# Patient Record
Sex: Female | Born: 1960 | Race: White | Hispanic: No | Marital: Married | Smoking: Former smoker
Health system: Southern US, Community
[De-identification: ages and names within clinical notes are randomized; demographics above are authoritative.]

## PROBLEM LIST (undated history)

## (undated) DIAGNOSIS — R5383 Other fatigue: Secondary | ICD-10-CM

## (undated) DIAGNOSIS — Z8249 Family history of ischemic heart disease and other diseases of the circulatory system: Secondary | ICD-10-CM

## (undated) DIAGNOSIS — F32A Depression, unspecified: Secondary | ICD-10-CM

## (undated) DIAGNOSIS — E039 Hypothyroidism, unspecified: Secondary | ICD-10-CM

## (undated) DIAGNOSIS — L659 Nonscarring hair loss, unspecified: Secondary | ICD-10-CM

## (undated) DIAGNOSIS — F419 Anxiety disorder, unspecified: Secondary | ICD-10-CM

## (undated) DIAGNOSIS — F909 Attention-deficit hyperactivity disorder, unspecified type: Secondary | ICD-10-CM

## (undated) DIAGNOSIS — R001 Bradycardia, unspecified: Secondary | ICD-10-CM

## (undated) DIAGNOSIS — E079 Disorder of thyroid, unspecified: Secondary | ICD-10-CM

## (undated) HISTORY — DX: Disorder of thyroid, unspecified: E07.9

## (undated) HISTORY — DX: Depression, unspecified: F32.A

## (undated) HISTORY — DX: Anxiety disorder, unspecified: F41.9

## (undated) HISTORY — DX: Other fatigue: R53.83

## (undated) HISTORY — DX: Nonscarring hair loss, unspecified: L65.9

## (undated) HISTORY — DX: Hypothyroidism, unspecified: E03.9

## (undated) HISTORY — DX: Attention-deficit hyperactivity disorder, unspecified type: F90.9

## (undated) HISTORY — DX: Family history of ischemic heart disease and other diseases of the circulatory system: Z82.49

## (undated) HISTORY — PX: COSMETIC SURGERY: SHX468

## (undated) HISTORY — DX: Bradycardia, unspecified: R00.1

---

## 1988-11-19 HISTORY — PX: PLACEMENT OF BREAST IMPLANTS: SHX6334

## 1999-02-06 ENCOUNTER — Other Ambulatory Visit: Admission: RE | Admit: 1999-02-06 | Discharge: 1999-02-06 | Payer: Self-pay | Admitting: Obstetrics and Gynecology

## 1999-03-05 ENCOUNTER — Encounter: Payer: Self-pay | Admitting: Obstetrics & Gynecology

## 1999-03-05 ENCOUNTER — Inpatient Hospital Stay (HOSPITAL_COMMUNITY): Admission: AD | Admit: 1999-03-05 | Discharge: 1999-03-05 | Payer: Self-pay | Admitting: Obstetrics & Gynecology

## 1999-05-09 ENCOUNTER — Inpatient Hospital Stay (HOSPITAL_COMMUNITY): Admission: AD | Admit: 1999-05-09 | Discharge: 1999-05-09 | Payer: Self-pay | Admitting: Obstetrics and Gynecology

## 1999-08-07 ENCOUNTER — Encounter: Payer: Self-pay | Admitting: Obstetrics and Gynecology

## 1999-08-07 ENCOUNTER — Inpatient Hospital Stay (HOSPITAL_COMMUNITY): Admission: AD | Admit: 1999-08-07 | Discharge: 1999-08-07 | Payer: Self-pay | Admitting: Obstetrics and Gynecology

## 1999-08-09 ENCOUNTER — Inpatient Hospital Stay (HOSPITAL_COMMUNITY): Admission: AD | Admit: 1999-08-09 | Discharge: 1999-08-09 | Payer: Self-pay | Admitting: Obstetrics and Gynecology

## 1999-08-10 ENCOUNTER — Encounter (INDEPENDENT_AMBULATORY_CARE_PROVIDER_SITE_OTHER): Payer: Self-pay

## 1999-08-10 ENCOUNTER — Inpatient Hospital Stay (HOSPITAL_COMMUNITY): Admission: AD | Admit: 1999-08-10 | Discharge: 1999-08-13 | Payer: Self-pay | Admitting: Obstetrics and Gynecology

## 1999-09-12 ENCOUNTER — Other Ambulatory Visit: Admission: RE | Admit: 1999-09-12 | Discharge: 1999-09-12 | Payer: Self-pay | Admitting: Obstetrics and Gynecology

## 2000-09-24 ENCOUNTER — Other Ambulatory Visit: Admission: RE | Admit: 2000-09-24 | Discharge: 2000-09-24 | Payer: Self-pay | Admitting: Obstetrics and Gynecology

## 2001-12-02 ENCOUNTER — Other Ambulatory Visit: Admission: RE | Admit: 2001-12-02 | Discharge: 2001-12-02 | Payer: Self-pay | Admitting: Obstetrics and Gynecology

## 2002-12-23 ENCOUNTER — Other Ambulatory Visit: Admission: RE | Admit: 2002-12-23 | Discharge: 2002-12-23 | Payer: Self-pay | Admitting: Obstetrics and Gynecology

## 2003-01-12 ENCOUNTER — Ambulatory Visit (HOSPITAL_COMMUNITY): Admission: RE | Admit: 2003-01-12 | Discharge: 2003-01-12 | Payer: Self-pay | Admitting: Gastroenterology

## 2003-02-19 ENCOUNTER — Other Ambulatory Visit: Admission: RE | Admit: 2003-02-19 | Discharge: 2003-02-19 | Payer: Self-pay | Admitting: Obstetrics and Gynecology

## 2004-03-24 ENCOUNTER — Other Ambulatory Visit: Admission: RE | Admit: 2004-03-24 | Discharge: 2004-03-24 | Payer: Self-pay | Admitting: Obstetrics and Gynecology

## 2005-04-26 ENCOUNTER — Other Ambulatory Visit: Admission: RE | Admit: 2005-04-26 | Discharge: 2005-04-26 | Payer: Self-pay | Admitting: Obstetrics and Gynecology

## 2005-11-28 ENCOUNTER — Encounter: Admission: RE | Admit: 2005-11-28 | Discharge: 2005-11-28 | Payer: Self-pay | Admitting: Family Medicine

## 2009-09-09 ENCOUNTER — Ambulatory Visit (HOSPITAL_COMMUNITY): Admission: RE | Admit: 2009-09-09 | Discharge: 2009-09-09 | Payer: Self-pay | Admitting: Obstetrics and Gynecology

## 2009-09-09 ENCOUNTER — Encounter (INDEPENDENT_AMBULATORY_CARE_PROVIDER_SITE_OTHER): Payer: Self-pay | Admitting: Obstetrics and Gynecology

## 2011-02-22 LAB — CBC
HCT: 39.9 % (ref 36.0–46.0)
Hemoglobin: 13.5 g/dL (ref 12.0–15.0)
MCHC: 34 g/dL (ref 30.0–36.0)
MCV: 93 fL (ref 78.0–100.0)
Platelets: 248 10*3/uL (ref 150–400)
RBC: 4.28 MIL/uL (ref 3.87–5.11)
RDW: 12.4 % (ref 11.5–15.5)
WBC: 7.3 10*3/uL (ref 4.0–10.5)

## 2011-02-22 LAB — HCG, SERUM, QUALITATIVE: Preg, Serum: NEGATIVE

## 2012-11-19 HISTORY — PX: COLONOSCOPY: SHX174

## 2013-07-10 ENCOUNTER — Encounter: Payer: Self-pay | Admitting: Gastroenterology

## 2013-08-06 ENCOUNTER — Ambulatory Visit (INDEPENDENT_AMBULATORY_CARE_PROVIDER_SITE_OTHER): Payer: BC Managed Care – PPO | Admitting: Gastroenterology

## 2013-08-06 ENCOUNTER — Encounter: Payer: Self-pay | Admitting: Gastroenterology

## 2013-08-06 VITALS — BP 100/60 | HR 88 | Ht 65.0 in | Wt 143.6 lb

## 2013-08-06 DIAGNOSIS — R198 Other specified symptoms and signs involving the digestive system and abdomen: Secondary | ICD-10-CM

## 2013-08-06 DIAGNOSIS — R194 Change in bowel habit: Secondary | ICD-10-CM | POA: Insufficient documentation

## 2013-08-06 NOTE — Progress Notes (Signed)
History of Present Illness: Pleasant 52 year old white female referred for evaluation of change of bowel habits.  For several months she's had intermittent episodes of crampy lower bowel pain followed by 3-4 loose stools with mucus.  This may occur once a week.  She denies rectal bleeding or melena.  There has been no change in medications or use of antibiotics.  Weight is stable.    Past Medical History  Diagnosis Date  . Anxiety    History reviewed. No pertinent past surgical history. family history includes Breast cancer in an other family member; Diabetes in an other family member; Heart disease in an other family member; Liver disease in an other family member. Current Outpatient Prescriptions  Medication Sig Dispense Refill  . amphetamine-dextroamphetamine (ADDERALL) 20 MG tablet Take 20 mg by mouth daily.      Marland Kitchen aspirin 81 MG tablet Take 81 mg by mouth daily.      Marland Kitchen buPROPion (WELLBUTRIN XL) 150 MG 24 hr tablet Take 150 mg by mouth daily.      . fish oil-omega-3 fatty acids 1000 MG capsule Take 2 g by mouth daily.      . Multiple Vitamin (MULTIVITAMIN) tablet Take 1 tablet by mouth daily.      . Probiotic Product (PROBIOTIC DAILY PO) Take by mouth.       No current facility-administered medications for this visit.   Allergies as of 08/06/2013  . (No Known Allergies)    reports that she has quit smoking. She has never used smokeless tobacco. She reports that she does not drink alcohol or use illicit drugs.     Review of Systems: Pertinent positive and negative review of systems were noted in the above HPI section. All other review of systems were otherwise negative.  Vital signs were reviewed in today's medical record Physical Exam: General: Well developed , well nourished, no acute distress Skin: anicteric Head: Normocephalic and atraumatic Eyes:  sclerae anicteric, EOMI Ears: Normal auditory acuity Mouth: No deformity or lesions Neck: Supple, no masses or  thyromegaly Lungs: Clear throughout to auscultation Heart: Regular rate and rhythm; no murmurs, rubs or bruits Abdomen: Soft, non tender and non distended. No masses, hepatosplenomegaly or hernias noted. Normal Bowel sounds Rectal:deferred Musculoskeletal: Symmetrical with no gross deformities  Skin: No lesions on visible extremities Pulses:  Normal pulses noted Extremities: No clubbing, cyanosis, edema or deformities noted Neurological: Alert oriented x 4, grossly nonfocal Cervical Nodes:  No significant cervical adenopathy Inguinal Nodes: No significant inguinal adenopathy Psychological:  Alert and cooperative. Normal mood and affect

## 2013-08-06 NOTE — Patient Instructions (Addendum)

## 2013-08-06 NOTE — Assessment & Plan Note (Signed)
History of intermittent crampy abdominal pain with multiple loose stools and mucus of unclear etiology.  She's had no intolerance to foods in the past but this could be a possibility.  A structural abnormality the colon should be ruled out.  Recommendations #1 colonoscopy #2 patient was instructed to take a dietary history when she is symptomatic

## 2013-08-07 ENCOUNTER — Ambulatory Visit (AMBULATORY_SURGERY_CENTER): Payer: BC Managed Care – PPO | Admitting: Gastroenterology

## 2013-08-07 ENCOUNTER — Encounter: Payer: Self-pay | Admitting: Gastroenterology

## 2013-08-07 VITALS — BP 95/67 | HR 73 | Temp 97.5°F | Resp 16 | Ht 65.0 in | Wt 143.0 lb

## 2013-08-07 DIAGNOSIS — D126 Benign neoplasm of colon, unspecified: Secondary | ICD-10-CM

## 2013-08-07 DIAGNOSIS — R198 Other specified symptoms and signs involving the digestive system and abdomen: Secondary | ICD-10-CM

## 2013-08-07 MED ORDER — HYDROCORTISONE ACETATE 25 MG RE SUPP: RECTAL | Status: DC

## 2013-08-07 MED ORDER — SODIUM CHLORIDE 0.9 % IV SOLN: 500.0000 mL | INTRAVENOUS | Status: DC

## 2013-08-07 NOTE — Progress Notes (Signed)
Called to room to assist during endoscopic procedure.  Patient ID and intended procedure confirmed with present staff. Received instructions for my participation in the procedure from the performing physician.  

## 2013-08-07 NOTE — Patient Instructions (Addendum)

## 2013-08-07 NOTE — Op Note (Signed)
[facility] [address] [place], [address]   COLONOSCOPY PROCEDURE REPORT  PATIENT: Danielle Hudson, Danielle Hudson  MR#: [id number] BIRTHDATE: 07/15/1961 , 51  yrs. old GENDER: Female ENDOSCOPIST: Louis Meckel, MD REFERRED BY: PROCEDURE DATE:  08/07/2013 PROCEDURE:   Colonoscopy with biopsy First Screening Colonoscopy - Avg.  risk and is 50 yrs.  old or older Yes.  Prior Negative Screening - Now for repeat screening. N/A  History of Adenoma - Now for follow-up colonoscopy & has been > or = to 3 yrs.  N/A  Polyps Removed Today? No.  Recommend repeat exam, <10 yrs? No. ASA CLASS:   Class I INDICATIONS:Unexplained diarrhea. MEDICATIONS: MAC sedation, administered by CRNA and propofol (Diprivan) 250mg  IV  DESCRIPTION OF PROCEDURE:   After the risks benefits and alternatives of the procedure were thoroughly explained, informed consent was obtained.  A digital rectal exam revealed no abnormalities of the rectum.   The LB [id number]  endoscope was introduced through the anus and advanced to the terminal ileum which was intubated for a short distance. No adverse events experienced.   The quality of the prep was excellent using Suprep The instrument was then slowly withdrawn as the colon was fully examined.      COLON FINDINGS: In the rectal vault there appear to be faint erythema and edema.  Multiple biopsies were taken.   The colon mucosa was otherwise normal.   The mucosa appeared normal in the terminal ileum.  Retroflexed views revealed no abnormalities. The time to cecum=3 minutes 43 seconds.  Withdrawal time=9 minutes 27 seconds.  The scope was withdrawn and the procedure completed. COMPLICATIONS: There were no complications.  ENDOSCOPIC IMPRESSION: 1.   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

## 2013-08-07 NOTE — Progress Notes (Signed)
Stable

## 2013-08-07 NOTE — Progress Notes (Signed)
Patient did not experience any of the following events: a burn prior to discharge; a fall within the facility; wrong site/side/patient/procedure/implant event; or a hospital transfer or hospital admission upon discharge from the facility. (G8907) Patient did not have preoperative order for IV antibiotic SSI prophylaxis. (G8918)  

## 2013-08-10 ENCOUNTER — Telehealth: Payer: Self-pay | Admitting: *Deleted

## 2013-08-10 NOTE — Telephone Encounter (Signed)
  Follow up Call-  Call back number 08/07/2013  Post procedure Call Back phone  # [phone]  Permission to leave phone message Yes     Patient questions:  Do you have a fever, pain , or abdominal swelling? no Pain Score  0 *  Have you tolerated food without any problems? yes  Have you been able to return to your normal activities? yes  Do you have any questions about your discharge instructions: Diet   no Medications  no Follow up visit  no  Do you have questions or concerns about your Care? no  Actions: * If pain score is 4 or above: No action needed, pain <4.

## 2013-08-17 NOTE — Progress Notes (Signed)
Quick Note:  Please inform the patient that biopsies were normal and to continue current plan of action ______

## 2013-08-20 ENCOUNTER — Encounter: Payer: Self-pay | Admitting: *Deleted

## 2013-08-24 ENCOUNTER — Telehealth: Payer: Self-pay | Admitting: *Deleted

## 2013-08-24 NOTE — Telephone Encounter (Signed)
No answer, left message for pt to give [place] a call back. Calling to give pt results of biopsies per Dr Arlyce Dice.

## 2013-08-31 ENCOUNTER — Encounter: Payer: Self-pay | Admitting: Gastroenterology

## 2013-08-31 ENCOUNTER — Ambulatory Visit (INDEPENDENT_AMBULATORY_CARE_PROVIDER_SITE_OTHER): Payer: BC Managed Care – PPO | Admitting: Gastroenterology

## 2013-08-31 VITALS — BP 100/70 | HR 92 | Ht 66.0 in | Wt 147.5 lb

## 2013-08-31 DIAGNOSIS — R194 Change in bowel habit: Secondary | ICD-10-CM

## 2013-08-31 DIAGNOSIS — R198 Other specified symptoms and signs involving the digestive system and abdomen: Secondary | ICD-10-CM

## 2013-08-31 NOTE — Assessment & Plan Note (Signed)
No abnormalities seen by colonoscopy.  Patient has no GI complaints referable to constipation.  Recommendations #1 fiber supplementation as needed

## 2013-08-31 NOTE — Patient Instructions (Signed)
Follow up as needed

## 2013-08-31 NOTE — Progress Notes (Signed)
History of Present Illness:  Danielle Hudson has returned following colonoscopy.  No specific abnormalities were seen (question of proctitis was not verified by pathology)  Random biopsies were negative.  She has a bowel movement every 2-3 days.    Review of Systems: Pertinent positive and negative review of systems were noted in the above HPI section. All other review of systems were otherwise negative.    Current Medications, Allergies, Past Medical History, Past Surgical History, Family History and Social History were reviewed in [employer] electronic medical record  Vital signs were reviewed in today's medical record. Physical Exam: General: Well developed , well nourished, no acute distress

## 2013-09-14 DIAGNOSIS — Z7189 Other specified counseling: Secondary | ICD-10-CM | POA: Insufficient documentation

## 2014-10-25 ENCOUNTER — Other Ambulatory Visit: Payer: Self-pay | Admitting: Obstetrics and Gynecology

## 2014-10-26 LAB — CYTOLOGY - PAP

## 2017-02-25 ENCOUNTER — Encounter: Payer: Self-pay | Admitting: *Deleted

## 2017-03-07 ENCOUNTER — Ambulatory Visit (INDEPENDENT_AMBULATORY_CARE_PROVIDER_SITE_OTHER): Payer: Self-pay | Admitting: *Deleted

## 2017-03-07 ENCOUNTER — Ambulatory Visit: Payer: Self-pay | Admitting: *Deleted

## 2017-03-07 DIAGNOSIS — I781 Nevus, non-neoplastic: Secondary | ICD-10-CM

## 2017-03-07 NOTE — Progress Notes (Signed)
X=.3% Sotradecol administered with a 27g butterfly.  Patient received a total of 6cc.  Treated all areas of concern. She'll be a good candidate for CL from here on out. Tol well. Will follow prn.    Compression stockings applied: Yes.

## 2017-03-13 ENCOUNTER — Encounter: Payer: Self-pay | Admitting: *Deleted

## 2018-06-26 ENCOUNTER — Encounter: Payer: Self-pay | Admitting: Podiatry

## 2018-06-26 ENCOUNTER — Ambulatory Visit (INDEPENDENT_AMBULATORY_CARE_PROVIDER_SITE_OTHER): Payer: BLUE CROSS/BLUE SHIELD

## 2018-06-26 ENCOUNTER — Ambulatory Visit (INDEPENDENT_AMBULATORY_CARE_PROVIDER_SITE_OTHER): Payer: BLUE CROSS/BLUE SHIELD | Admitting: Podiatry

## 2018-06-26 ENCOUNTER — Other Ambulatory Visit: Payer: Self-pay | Admitting: Podiatry

## 2018-06-26 DIAGNOSIS — D169 Benign neoplasm of bone and articular cartilage, unspecified: Secondary | ICD-10-CM

## 2018-06-26 DIAGNOSIS — M25571 Pain in right ankle and joints of right foot: Secondary | ICD-10-CM

## 2018-06-26 DIAGNOSIS — M722 Plantar fascial fibromatosis: Secondary | ICD-10-CM

## 2018-06-26 DIAGNOSIS — M7662 Achilles tendinitis, left leg: Secondary | ICD-10-CM

## 2018-06-26 MED ORDER — TRIAMCINOLONE ACETONIDE 10 MG/ML IJ SUSP
10.0000 mg | Freq: Once | INTRAMUSCULAR | Status: AC
  Administered 2018-06-26: 10 mg

## 2018-06-26 MED ORDER — DICLOFENAC SODIUM 75 MG PO TBEC: 75.0000 mg | DELAYED_RELEASE_TABLET | Freq: Two times a day (BID) | ORAL | 2 refills | Status: DC

## 2018-06-26 NOTE — Progress Notes (Signed)
   Subjective:    Patient ID: Danielle Hudson, female    DOB: 12-27-1960, 57 y.o.   MRN: [id number]  HPI    Review of Systems  All other systems reviewed and are negative.      Objective:   Physical Exam        Assessment & Plan:

## 2018-06-26 NOTE — Patient Instructions (Signed)

## 2018-06-26 NOTE — Progress Notes (Signed)
Subjective:   Patient ID: Danielle Hudson, female   DOB: 57 y.o.   MRN: [id number]   HPI Patient presents stating that she is had a lot of pain in the inside of her left ankle and she has problems with her left fifth toe with inability to wear shoe gear comfortably and also a knot in her left arch of several months duration.  Patient states that she had a previous injection of the heel and wear a boot but only for a couple days and is been very tender that was done approximately 3 months ago.  Patient does not smoke and likes to be active   Review of Systems  All other systems reviewed and are negative.       Objective:  Physical Exam  Constitutional: She appears well-developed and well-nourished.  Cardiovascular: Intact distal pulses.  Pulmonary/Chest: Effort normal.  Musculoskeletal: Normal range of motion.  Neurological: She is alert.  Skin: Skin is warm.  Nursing note and vitals reviewed.   Neurovascular status intact with muscle strength adequate range of motion within normal limits.  Patient is found to have quite a bit inflammation of the medial side of the Achilles at its insertion the calcaneus with no indications of tendon dysfunction or damage.  Patient has spur on the left fifth digit medial side is painful when pressed and also has a small nodule in the plantar aspect of the left mid arch measuring about 6 x 6 mm and it appears to be within the plantar fascia itself.  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  I do think an injection of the left Achilles along with continued complete immobilization would be her best option and I  did discuss that there is no guarantee that this will solve it and that the possibilities for rupture exists with the injection.  He is willing to accept the risk of the injection understanding possibility for rupture and today I did a sterile prep in the medial side that did not into the Achilles but medial to it with 3 mg dexamethasone Kenalog 5 mg Xylocaine instructed on the importance of boot usage advised on ice therapy physical therapy placed on oral anti-inflammatory diclofenac 75 mg twice daily and discussed surgical correction of the spur on her left fifth digit which we can do in the office.  The arch will be watched and may require treatment depending on how it progresses  X-ray indicates minimal posterior spurring with spur on the fifth digit left foot and no indications calcification of the arch

## 2018-07-17 ENCOUNTER — Ambulatory Visit: Payer: BLUE CROSS/BLUE SHIELD | Admitting: Podiatry

## 2018-07-18 ENCOUNTER — Telehealth: Payer: Self-pay | Admitting: *Deleted

## 2018-07-18 ENCOUNTER — Encounter: Payer: Self-pay | Admitting: Podiatry

## 2018-07-18 ENCOUNTER — Ambulatory Visit: Payer: BLUE CROSS/BLUE SHIELD | Admitting: Podiatry

## 2018-07-18 DIAGNOSIS — M7662 Achilles tendinitis, left leg: Secondary | ICD-10-CM | POA: Diagnosis not present

## 2018-07-18 DIAGNOSIS — D169 Benign neoplasm of bone and articular cartilage, unspecified: Secondary | ICD-10-CM | POA: Diagnosis not present

## 2018-07-18 NOTE — Progress Notes (Signed)
Subjective:   Patient ID: Danielle Hudson, female   DOB: 57 y.o.   MRN: [id number]   HPI Patient states she is feeling a lot better with minimal discomfort and is able to walk without significant pain.  States the fifth toe is still really bothering her   ROS      Objective:  Physical Exam  Achilles tendon left at the insertion into the calcaneus lateral side is doing much better with significant reduction of pain with patient's fifth toe left being very tender upon palpation with a spur on the medial side of the digit.  Patient also has a small keratotic lesion associated with this     Assessment:  Exostosis fifth toe left with pain and Achilles tendinitis left improved     Plan:  H&P condition reviewed and for the Achilles tendon continue stretching exercises.  For the spur I recommended removal and I allowed her to read consent form going over exostectomy and alternative treatments complications.  Patient wants surgery understanding risk and at this point signed consent form after extensive review and is scheduled for office outpatient surgery and is encouraged to call with questions

## 2018-07-18 NOTE — Telephone Encounter (Signed)
"  I need to set up an in office surgical procedure.  I was told I need to set that up for a Wednesday.  I would need for that to be late in the day as possible.  If you could give me a call back.  I'd greatly appreciate it.  Let me know what we can set up.  If you can't call me today, I hope you have a great labor day weekend."

## 2018-07-18 NOTE — Patient Instructions (Signed)
Pre-Operative Instructions  Congratulations, you have decided to take an important step towards improving your quality of life.  You can be assured that the doctors and staff at [facility] will be with you every step of the way.  Here are some important things you should know:  1. Plan to be at the surgery center/hospital at least 1 (one) hour prior to your scheduled time, unless otherwise directed by the surgical center/hospital staff.  You must have a responsible adult accompany you, remain during the surgery and drive you home.  Make sure you have directions to the surgical center/hospital to ensure you arrive on time. 2. If you are having surgery at [facility] or [facility] hospitals, you will need a copy of your medical history and physical form from your family physician within one month prior to the date of surgery. We will give you a form for your primary physician to complete.  3. We make every effort to accommodate the date you request for surgery.  However, there are times where surgery dates or times have to be moved.  We will contact you as soon as possible if a change in schedule is required.   4. No aspirin/ibuprofen for one week before surgery.  If you are on aspirin, any non-steroidal anti-inflammatory medications (Mobic, Aleve, Ibuprofen) should not be taken seven (7) days prior to your surgery.  You make take Tylenol for pain prior to surgery.  5. Medications - If you are taking daily heart and blood pressure medications, seizure, reflux, allergy, asthma, anxiety, pain or diabetes medications, make sure you notify the surgery center/hospital before the day of surgery so they can tell you which medications you should take or avoid the day of surgery. 6. No food or drink after midnight the night before surgery unless directed otherwise by surgical center/hospital staff. 7. No alcoholic beverages 24-hours prior to surgery.  No smoking 24-hours prior or 24-hours after  surgery. 8. Wear loose pants or shorts. They should be loose enough to fit over bandages, boots, and casts. 9. Don't wear slip-on shoes. Sneakers are preferred. 10. Bring your boot with you to the surgery center/hospital.  Also bring crutches or a walker if your physician has prescribed it for you.  If you do not have this equipment, it will be provided for you after surgery. 11. If you have not been contacted by the surgery center/hospital by the day before your surgery, call to confirm the date and time of your surgery. 12. Leave-time from work may vary depending on the type of surgery you have.  Appropriate arrangements should be made prior to surgery with your employer. 13. Prescriptions will be provided immediately following surgery by your doctor.  Fill these as soon as possible after surgery and take the medication as directed. Pain medications will not be refilled on weekends and must be approved by the doctor. 14. Remove nail polish on the operative foot and avoid getting pedicures prior to surgery. 15. Wash the night before surgery.  The night before surgery wash the foot and leg well with water and the antibacterial soap provided. Be sure to pay special attention to beneath the toenails and in between the toes.  Wash for at least three (3) minutes. Rinse thoroughly with water and dry well with a towel.  Perform this wash unless told not to do so by your physician.  Enclosed: 1 Ice pack (please put in freezer the night before surgery)   1 Hibiclens skin cleaner     Pre-op instructions  If you have any questions regarding the instructions, please do not hesitate to call our office.  [place]: [address], [place], [place] [address] -- [phone]  [place]: [address] [place], [place] [address] -- [phone]  [place]: [address]  [place], [place] [address] -- [phone]  [place]: [address], [address], [place],  [address] -- [phone]  Website:  [email or web address] 

## 2018-07-24 NOTE — Telephone Encounter (Signed)
"  I need set up a surgery for a minor procedure on my small toe.  I was told you only do that on Wednesdays.  I want to know what the latest appointment will be so I can get my appointment book arranged around that.  Give me a call back."

## 2018-07-29 NOTE — Telephone Encounter (Signed)
I am returning your call.  You want to set up your office surgery?  The latest he can do it is 3 pm.  "Oh, that's great.  Yes, I would like to schedule it.  What does he have available."  He can do it on October 2.  "I have a prior engagement, can we do it the following week?"  Yes, he can do it on October 9, be here at 3 pm.

## 2018-07-29 NOTE — Telephone Encounter (Signed)
I called her back and informed her that Dr. Paulla Dolly will be out of the office on October 9.  I asked her if we could reschedule it to October 16.  She said that would be fine.

## 2018-09-03 ENCOUNTER — Encounter: Payer: Self-pay | Admitting: Podiatry

## 2018-09-03 ENCOUNTER — Ambulatory Visit (INDEPENDENT_AMBULATORY_CARE_PROVIDER_SITE_OTHER): Payer: BLUE CROSS/BLUE SHIELD | Admitting: Podiatry

## 2018-09-03 ENCOUNTER — Telehealth: Payer: Self-pay | Admitting: Podiatry

## 2018-09-03 ENCOUNTER — Telehealth: Payer: Self-pay | Admitting: *Deleted

## 2018-09-03 ENCOUNTER — Other Ambulatory Visit: Payer: Self-pay | Admitting: Podiatry

## 2018-09-03 VITALS — BP 115/74 | HR 105 | Temp 97.4°F

## 2018-09-03 DIAGNOSIS — D169 Benign neoplasm of bone and articular cartilage, unspecified: Secondary | ICD-10-CM | POA: Diagnosis not present

## 2018-09-03 MED ORDER — HYDROCODONE-ACETAMINOPHEN 10-325 MG PO TABS: 1.0000 | ORAL_TABLET | ORAL | 0 refills | Status: DC | PRN

## 2018-09-03 NOTE — Progress Notes (Signed)
Post procedural pain management

## 2018-09-03 NOTE — Telephone Encounter (Signed)
I just had an office procedure where a bone spur was removed from my 5th left toe. I don't think I was told what I need to put on that incision to make it heal. If you could give me a call at [phone].

## 2018-09-03 NOTE — Progress Notes (Signed)
Subjective:   Patient ID: Danielle Hudson, female   DOB: 57 y.o.   MRN: [id number]   HPI Patient presents for exostectomy fifth digit left states this has been very tender for her and it makes it hard to wear shoe gear   ROS      Objective:  Physical Exam  Neurovascular status intact with prominent spur on the medial side of the fifth digit left is painful     Assessment:  Exostotic lesion digit 5 left     Plan:  Patient was injected with 60 mill grams like Marcaine mixture and taken to the OR.  Patient had sterile prep applied and I then inflated tourniquet to 250 mmHg after exsanguinating the foot and the following procedure was performed.  Attention was directed to the medial aspect of digit 5 left were small semielliptical incision was made over the offending bone spur.  The small wedge of skin was removed entirely and taken down to bone and the bone was exposed.  I then used a combination of rasped and small sagittal saw and took office for and continue to rest until it was at smooth surface.  I flushed with copious nonsterile Garamycin solution and sutured with 4-0 nylon and released the tourniquet and capillary fill is noted immediate to all digits on the left foot.  Patient left the OR in satisfactory condition with surgical shoe

## 2018-09-03 NOTE — Telephone Encounter (Signed)
Called patient back at [phone] to answer questions regarding the pain they were having from their office surgery today, Wednesday, September 03, 2018 with Dr.Regal.  Patient stated, "I am in a lot of pain. I am taking Ibuprofen 800 mg and need something stronger so I can sleep through the night".  Patient wanted to know if she needed to put any type of ointment for healing on her incision site. Per Dr.Regal, "just put a band aid over it and it will heal naturally". Patient also complained on throbbing and tightness on her foot. I told patient to go ahead a remove the green coflex and nothing more to help ease her discomfort. I also instructed her to go ahead a elevate her foot as needed.   Dr. Paulla Dolly did approve patient to get Vicodin 10-325 mg; dispensed 20 with no refills. Prescription went over to her [facility] in [place], [place]. This was electronically sent over at 4:50 pm by Dr. Amalia Hailey.

## 2018-09-04 ENCOUNTER — Telehealth: Payer: Self-pay | Admitting: *Deleted

## 2018-09-04 NOTE — Telephone Encounter (Signed)
Called patient at [phone] to check to see how they were doing from their procedure with Dr. Paulla Dolly on Wednesday, September 03, 2018.  Patient stated, "I am doing better. Sometimes my foot hurts and sometimes it does not. My pain medication is helping me. I took 2 pain pills today and one advil. I am elevating my foot when I need to."

## 2018-09-17 ENCOUNTER — Ambulatory Visit (INDEPENDENT_AMBULATORY_CARE_PROVIDER_SITE_OTHER): Payer: BLUE CROSS/BLUE SHIELD

## 2018-09-17 ENCOUNTER — Ambulatory Visit (INDEPENDENT_AMBULATORY_CARE_PROVIDER_SITE_OTHER): Payer: Self-pay | Admitting: Podiatry

## 2018-09-17 DIAGNOSIS — D169 Benign neoplasm of bone and articular cartilage, unspecified: Secondary | ICD-10-CM

## 2018-09-18 NOTE — Progress Notes (Signed)
Subjective:   Patient ID: Danielle Hudson, female   DOB: 57 y.o.   MRN: [id number]   HPI Patient states toe feels fine with no pain and wearing dress   ROS      Objective:  Physical Exam  Neurovascular status intact with stitches intact fifth digit left wound edges well coapted no drainage and what appears to be satisfactory resection about     Assessment:  Doing well post exostectomy fifth digit left     Plan:  Stitches removed wound edges coapted well reviewed x-rays and reappoint to recheck again as needed and applied dressing to the toe  X-ray indicates satisfactory resection of bone with good alignment of the digit

## 2018-12-09 ENCOUNTER — Other Ambulatory Visit: Payer: Self-pay | Admitting: Obstetrics and Gynecology

## 2018-12-09 DIAGNOSIS — Z9189 Other specified personal risk factors, not elsewhere classified: Secondary | ICD-10-CM

## 2019-05-07 ENCOUNTER — Ambulatory Visit
Admission: RE | Admit: 2019-05-07 | Discharge: 2019-05-07 | Disposition: A | Discharge status: Home / Self Care | Payer: BC Managed Care – PPO | Source: Ambulatory Visit | Attending: Obstetrics and Gynecology | Admitting: Obstetrics and Gynecology

## 2019-05-07 ENCOUNTER — Other Ambulatory Visit: Payer: Self-pay

## 2019-05-07 DIAGNOSIS — Z9189 Other specified personal risk factors, not elsewhere classified: Secondary | ICD-10-CM

## 2019-05-07 MED ORDER — GADOBUTROL 1 MMOL/ML IV SOLN
8.0000 mL | Freq: Once | INTRAVENOUS | Status: AC | PRN
  Administered 2019-05-07: 08:00:00 8 mL via INTRAVENOUS

## 2019-12-09 ENCOUNTER — Ambulatory Visit: Payer: BLUE CROSS/BLUE SHIELD | Admitting: Podiatry

## 2019-12-14 DIAGNOSIS — E039 Hypothyroidism, unspecified: Secondary | ICD-10-CM | POA: Insufficient documentation

## 2019-12-16 ENCOUNTER — Other Ambulatory Visit: Payer: Self-pay | Admitting: Podiatry

## 2019-12-16 ENCOUNTER — Ambulatory Visit (INDEPENDENT_AMBULATORY_CARE_PROVIDER_SITE_OTHER): Payer: BC Managed Care – PPO

## 2019-12-16 ENCOUNTER — Other Ambulatory Visit: Payer: Self-pay

## 2019-12-16 ENCOUNTER — Ambulatory Visit: Payer: Self-pay | Admitting: Podiatry

## 2019-12-16 ENCOUNTER — Encounter: Payer: Self-pay | Admitting: Podiatry

## 2019-12-16 DIAGNOSIS — M79671 Pain in right foot: Secondary | ICD-10-CM

## 2019-12-16 DIAGNOSIS — M722 Plantar fascial fibromatosis: Secondary | ICD-10-CM | POA: Diagnosis not present

## 2019-12-16 DIAGNOSIS — D169 Benign neoplasm of bone and articular cartilage, unspecified: Secondary | ICD-10-CM

## 2019-12-16 MED ORDER — DICLOFENAC SODIUM 75 MG PO TBEC: 75.0000 mg | DELAYED_RELEASE_TABLET | Freq: Two times a day (BID) | ORAL | 2 refills | Status: DC

## 2019-12-16 NOTE — Patient Instructions (Signed)

## 2019-12-28 ENCOUNTER — Encounter: Payer: Self-pay | Admitting: Physician Assistant

## 2019-12-28 ENCOUNTER — Ambulatory Visit: Payer: BC Managed Care – PPO | Admitting: Physician Assistant

## 2019-12-28 ENCOUNTER — Other Ambulatory Visit: Payer: Self-pay

## 2019-12-28 VITALS — BP 110/70 | HR 95 | Temp 98.3°F | Resp 14 | Ht 66.0 in | Wt 156.0 lb

## 2019-12-28 DIAGNOSIS — R1906 Epigastric swelling, mass or lump: Secondary | ICD-10-CM | POA: Diagnosis not present

## 2019-12-28 DIAGNOSIS — F988 Other specified behavioral and emotional disorders with onset usually occurring in childhood and adolescence: Secondary | ICD-10-CM | POA: Diagnosis not present

## 2019-12-28 DIAGNOSIS — R0602 Shortness of breath: Secondary | ICD-10-CM | POA: Diagnosis not present

## 2019-12-28 DIAGNOSIS — E039 Hypothyroidism, unspecified: Secondary | ICD-10-CM | POA: Diagnosis not present

## 2019-12-28 LAB — LIPID PANEL
Cholesterol: 181 mg/dL (ref 0–200)
HDL: 57.1 mg/dL (ref >39.00)
NonHDL: 123.77
Total CHOL/HDL Ratio: 3
Triglycerides: 278 mg/dL — ABNORMAL HIGH (ref 0.0–149.0)
VLDL: 55.6 mg/dL — ABNORMAL HIGH (ref 0.0–40.0)

## 2019-12-28 LAB — CBC WITH DIFFERENTIAL/PLATELET
Basophils Absolute: 0.1 10*3/uL (ref 0.0–0.1)
Basophils Relative: 1.4 % (ref 0.0–3.0)
Eosinophils Absolute: 0.4 10*3/uL (ref 0.0–0.7)
Eosinophils Relative: 3.9 % (ref 0.0–5.0)
HCT: 42.1 % (ref 36.0–46.0)
Hemoglobin: 14 g/dL (ref 12.0–15.0)
Lymphocytes Relative: 22.2 % (ref 12.0–46.0)
Lymphs Abs: 2.3 10*3/uL (ref 0.7–4.0)
MCHC: 33.2 g/dL (ref 30.0–36.0)
MCV: 92.2 fl (ref 78.0–100.0)
Monocytes Absolute: 0.9 10*3/uL (ref 0.1–1.0)
Monocytes Relative: 8.4 % (ref 3.0–12.0)
Neutro Abs: 6.8 10*3/uL (ref 1.4–7.7)
Neutrophils Relative %: 64.1 % (ref 43.0–77.0)
Platelets: 310 10*3/uL (ref 150.0–400.0)
RBC: 4.56 Mil/uL (ref 3.87–5.11)
RDW: 12.9 % (ref 11.5–15.5)
WBC: 10.5 10*3/uL (ref 4.0–10.5)

## 2019-12-28 LAB — COMPREHENSIVE METABOLIC PANEL
ALT: 31 U/L (ref 0–35)
AST: 25 U/L (ref 0–37)
Albumin: 4.1 g/dL (ref 3.5–5.2)
Alkaline Phosphatase: 67 U/L (ref 39–117)
BUN: 20 mg/dL (ref 6–23)
CO2: 29 mEq/L (ref 19–32)
Calcium: 9.4 mg/dL (ref 8.4–10.5)
Chloride: 103 mEq/L (ref 96–112)
Creatinine, Ser: 0.98 mg/dL (ref 0.40–1.20)
GFR: 57.82 mL/min — ABNORMAL LOW (ref >60.00)
Glucose, Bld: 83 mg/dL (ref 70–99)
Potassium: 4.4 mEq/L (ref 3.5–5.1)
Sodium: 138 mEq/L (ref 135–145)
Total Bilirubin: 0.3 mg/dL (ref 0.2–1.2)
Total Protein: 6.6 g/dL (ref 6.0–8.3)

## 2019-12-28 LAB — LDL CHOLESTEROL, DIRECT: Direct LDL: 99 mg/dL

## 2019-12-28 LAB — TSH: TSH: 3.09 u[IU]/mL (ref 0.35–4.50)

## 2019-12-28 LAB — LIPASE: Lipase: 18 U/L (ref 11.0–59.0)

## 2019-12-28 MED ORDER — PANTOPRAZOLE SODIUM 40 MG PO TBEC: 40.0000 mg | DELAYED_RELEASE_TABLET | Freq: Every day | ORAL | 0 refills | Status: DC

## 2019-12-28 NOTE — Progress Notes (Signed)
Patient presents to clinic today to establish care.  Patient was previously seen across the street at [facility].  Is changing PCP due to insurance change.  Acute Concerns: Patient endorses several weeks of intermittent episodes of epigastric fullness, bloating, sometimes feeling like it is harder to catch her breath.  Denies overt abdominal pain.  Denies fever, chills, nausea or vomiting.  Denies change to her bowel habits.  Denies hematochezia, melena or tenesmus.  Denies change in diet but does endorse a very poor diet full of fried, fatty foods.  Does stay very active, doing Pilates several times per week.  Endorses keeping very well-hydrated.  Denies any noted heartburn or globus sensation.  Denies cough.  Patient also endorses occasional shortness of breath with exertion.  States this happens often when walking around her property.  States she is able to do Pilates a few times a week without issue.  Denies chest congestion or cough.  Denies known history of anemia or vitamin deficiency.  TSH previously been within normal limits per patient.  Chronic Issues: Anxiety/ADD --patient managed by psychiatry.  Is currently on a regimen of Adderall and Wellbutrin.  Endorses symptoms are well controlled with this regimen.  Follows up every 3 months.  Hypothyroidism --patient is currently on a regimen of levothyroxine 75 mcg once daily.  This was prescribed by her gynecologist who monitors this.  Endorses taking medication as directed.  Health Maintenance: Immunizations --declines flu shot.  Will obtain records for other immunizations. Colonoscopy --up-to-date per patient.  Will obtain records. Mammogram --up-to-date per patient.  Will obtain records.  Followed by GYN. PAP --up-to-date per patient.  Will obtain records.  Followed by GYN.  Past Medical History:  Diagnosis Date  . Anxiety   . Thyroid disease     Past Surgical History:  Procedure Laterality Date  . CESAREAN SECTION     . COSMETIC SURGERY    . PLACEMENT OF BREAST IMPLANTS Bilateral 1990    Current Outpatient Medications on File Prior to Visit  Medication Sig Dispense Refill  . amphetamine-dextroamphetamine (ADDERALL) 20 MG tablet Take 30 mg by mouth daily.     Marland Kitchen aspirin 81 MG tablet Take 81 mg by mouth daily.    Marland Kitchen buPROPion (WELLBUTRIN SR) 100 MG 12 hr tablet TK 1 T PO  D  6  . diclofenac (VOLTAREN) 75 MG EC tablet Take 1 tablet (75 mg total) by mouth 2 (two) times daily. 50 tablet 2  . DOCOSAHEXAENOIC ACID PO Take 1 g by mouth.    . fish oil-omega-3 fatty acids 1000 MG capsule Take 2 g by mouth daily.    Marland Kitchen gabapentin (NEURONTIN) 100 MG capsule     . hydrocortisone (ANUSOL-HC) 25 MG suppository Use one suppository each bedtime 10 suppository 1  . hydrocortisone butyrate (LUCOID) 0.1 % CREA cream hydrocortisone butyrate 0.1 % topical cream    . levothyroxine (SYNTHROID) 75 MCG tablet Take 75 mcg by mouth every morning.    . Multiple Vitamin (MULTIVITAMIN) tablet Take 1 tablet by mouth daily.    . Probiotic Product (PROBIOTIC DAILY PO) Take by mouth.    . valACYclovir (VALTREX) 500 MG tablet Take 500 mg by mouth 2 (two) times daily.    . Vitamin D, Ergocalciferol, (DRISDOL) 1.25 MG (50000 UNIT) CAPS capsule Take 50,000 Units by mouth once a week.     No current facility-administered medications on file prior to visit.    Allergies  Allergen Reactions  . Penicillin G  Unknown; patient was young when she had a reaction.    Family History  Problem Relation Age of Onset  . Breast cancer Other   . Diabetes Other   . Heart disease Other   . Liver disease Other   . Cancer Mother        Breast  . Diabetes Mother   . Heart disease Mother   . Cirrhosis Mother   . Liver disease Mother   . Hepatitis C Mother   . Alzheimer's disease Mother   . Diabetes Father   . Heart disease Father   . Alzheimer's disease Sister   . Cancer Brother        Lung    Social History   Socioeconomic History  .  Marital status: Married    Spouse name: Not on file  . Number of children: Not on file  . Years of education: Not on file  . Highest education level: Not on file  Occupational History  . Occupation: hair stylist    Employer: [employer]  Tobacco Use  . Smoking status: Former Smoker    Types: Cigarettes  . Smokeless tobacco: Never Used  Substance and Sexual Activity  . Alcohol use: No  . Drug use: No  . Sexual activity: Yes  Other Topics Concern  . Not on file  Social History Narrative  . Not on file   Social Determinants of Health   Financial Resource Strain:   . Difficulty of Paying Living Expenses: Not on file  Food Insecurity:   . Worried About Charity fundraiser in the Last Year: Not on file  . Ran Out of Food in the Last Year: Not on file  Transportation Needs:   . Lack of Transportation (Medical): Not on file  . Lack of Transportation (Non-Medical): Not on file  Physical Activity:   . Days of Exercise per Week: Not on file  . Minutes of Exercise per Session: Not on file  Stress:   . Feeling of Stress : Not on file  Social Connections:   . Frequency of Communication with Friends and Family: Not on file  . Frequency of Social Gatherings with Friends and Family: Not on file  . Attends Religious Services: Not on file  . Active Member of Clubs or Organizations: Not on file  . Attends Archivist Meetings: Not on file  . Marital Status: Not on file  Intimate Partner Violence:   . Fear of Current or Ex-Partner: Not on file  . Emotionally Abused: Not on file  . Physically Abused: Not on file  . Sexually Abused: Not on file   Review of Systems  Constitutional: Negative for fever and weight loss.  HENT: Negative for ear discharge, ear pain, hearing loss and tinnitus.   Eyes: Negative for blurred vision, double vision, photophobia and pain.  Respiratory: Negative for cough and shortness of breath.   Cardiovascular: Negative for chest pain and  palpitations.  Gastrointestinal: Negative for abdominal pain, blood in stool, constipation, diarrhea, heartburn, melena, nausea and vomiting.  Genitourinary: Negative for dysuria, flank pain, frequency, hematuria and urgency.  Musculoskeletal: Negative for falls.  Neurological: Negative for dizziness, loss of consciousness and headaches.  Endo/Heme/Allergies: Negative for environmental allergies.  Psychiatric/Behavioral: Negative for depression, hallucinations, substance abuse and suicidal ideas. The patient is not nervous/anxious and does not have insomnia.     BP 110/70   Pulse 95   Temp 98.3 F (36.8 C) (Temporal)   Resp 14  Ht 5\' 6"  (1.676 m)   Wt 156 lb (70.8 kg)   SpO2 98%   BMI 25.18 kg/m   Physical Exam Vitals reviewed.  Constitutional:      Appearance: She is well-developed.  HENT:     Head: Normocephalic and atraumatic.  Eyes:     Pupils: Pupils are equal, round, and reactive to light.  Neck:     Thyroid: No thyromegaly.  Cardiovascular:     Rate and Rhythm: Normal rate and regular rhythm.  Pulmonary:     Effort: Pulmonary effort is normal.     Breath sounds: Normal breath sounds.  Chest:     Chest wall: No tenderness.  Abdominal:     General: Bowel sounds are normal.     Palpations: Abdomen is soft. There is no hepatomegaly, splenomegaly or mass.     Tenderness: There is no abdominal tenderness. There is no guarding or rebound.  Musculoskeletal:     Cervical back: Neck supple.  Neurological:     General: No focal deficit present.     Mental Status: She is alert.  Psychiatric:        Mood and Affect: Mood normal.    Assessment/Plan: 1. SOBOE (shortness of breath on exertion) EKG today with normal sinus rhythm.  Patient asymptomatic.  Is able to do Pilates several times a week without any issue.  Does not seem cardiac in origin.  Will check labs today for further assessment. - EKG 12-Lead - CBC with Differential/Platelet - Comprehensive metabolic panel  - TSH  2. Epigastric fullness Suspect silent reflux, especially given diet high in fried and fatty foods.  Dietary recommendations reviewed.  Start trial of Protonix.  Recommend daily probiotic.  Handout given.  Follow-up discussed with patient.  We will check some labs today. - Comprehensive metabolic panel - Lipid panel - TSH - Lipase - pantoprazole (PROTONIX) 40 MG tablet; Take 1 tablet (40 mg total) by mouth daily.  Dispense: 30 tablet; Refill: 0  3. Hypothyroidism, unspecified type Managed by GYN.  TSH being checked today due to other symptoms noted.  Will forward results to GYN.  4. Attention deficit disorder, unspecified hyperactivity presence Continue management per specialist.  This visit occurred during the SARS-CoV-2 public health emergency.  Safety protocols were in place, including screening questions prior to the visit, additional usage of staff PPE, and extensive cleaning of exam room while observing appropriate contact time as indicated for disinfecting solutions.     Leeanne Rio, PA-C

## 2019-12-28 NOTE — Patient Instructions (Signed)
Please keep hydrated. Try to follow a lower fat diet -- limit fried foods, red meats, processed foods and stick with lean proteins, fruits and vegetables.  Take the Protonix as directed for 2 week trial. Let me know how symptoms are responding so we can make further decisions.   Your EKG looks good today. I am checking some labs to see if there is any anemia, thyroid abnormality, etc contributing to the windedness with exertion. If all negative we will likely proceed with stress testing.    Food Choices for Gastroesophageal Reflux Disease, Adult When you have gastroesophageal reflux disease (GERD), the foods you eat and your eating habits are very important. Choosing the right foods can help ease your discomfort. Think about working with a nutrition specialist (dietitian) to help you make good choices. What are tips for following this plan?  Meals  Choose healthy foods that are low in fat, such as fruits, vegetables, whole grains, low-fat dairy products, and lean meat, fish, and poultry.  Eat small meals often instead of 3 large meals a day. Eat your meals slowly, and in a place where you are relaxed. Avoid bending over or lying down until 2-3 hours after eating.  Avoid eating meals 2-3 hours before bed.  Avoid drinking a lot of liquid with meals.  Cook foods using methods other than frying. Bake, grill, or broil food instead.  Avoid or limit: ? Chocolate. ? Peppermint or spearmint. ? Alcohol. ? Pepper. ? Black and decaffeinated coffee. ? Black and decaffeinated tea. ? Bubbly (carbonated) soft drinks. ? Caffeinated energy drinks and soft drinks.  Limit high-fat foods such as: ? Fatty meat or fried foods. ? Whole milk, cream, butter, or ice cream. ? Nuts and nut butters. ? Pastries, donuts, and sweets made with butter or shortening.  Avoid foods that cause symptoms. These foods may be different for everyone. Common foods that cause symptoms include: ? Tomatoes. ? Oranges,  lemons, and limes. ? Peppers. ? Spicy food. ? Onions and garlic. ? Vinegar. Lifestyle  Maintain a healthy weight. Ask your doctor what weight is healthy for you. If you need to lose weight, work with your doctor to do so safely.  Exercise for at least 30 minutes for 5 or more days each week, or as told by your doctor.  Wear loose-fitting clothes.  Do not smoke. If you need help quitting, ask your doctor.  Sleep with the head of your bed higher than your feet. Use a wedge under the mattress or blocks under the bed frame to raise the head of the bed. Summary  When you have gastroesophageal reflux disease (GERD), food and lifestyle choices are very important in easing your symptoms.  Eat small meals often instead of 3 large meals a day. Eat your meals slowly, and in a place where you are relaxed.  Limit high-fat foods such as fatty meat or fried foods.  Avoid bending over or lying down until 2-3 hours after eating.  Avoid peppermint and spearmint, caffeine, alcohol, and chocolate. This information is not intended to replace advice given to you by your health care provider. Make sure you discuss any questions you have with your health care provider. Document Revised: 02/26/2019 Document Reviewed: 12/11/2016 Elsevier Patient Education  [employer].

## 2019-12-30 ENCOUNTER — Ambulatory Visit: Payer: BC Managed Care – PPO | Admitting: Podiatry

## 2020-04-28 ENCOUNTER — Ambulatory Visit: Payer: BC Managed Care – PPO | Admitting: Podiatry

## 2020-04-28 ENCOUNTER — Encounter: Payer: Self-pay | Admitting: Podiatry

## 2020-04-28 ENCOUNTER — Ambulatory Visit (INDEPENDENT_AMBULATORY_CARE_PROVIDER_SITE_OTHER): Payer: BC Managed Care – PPO

## 2020-04-28 ENCOUNTER — Other Ambulatory Visit: Payer: Self-pay | Admitting: Podiatry

## 2020-04-28 ENCOUNTER — Other Ambulatory Visit: Payer: Self-pay

## 2020-04-28 DIAGNOSIS — M722 Plantar fascial fibromatosis: Secondary | ICD-10-CM | POA: Diagnosis not present

## 2020-04-28 DIAGNOSIS — M205X1 Other deformities of toe(s) (acquired), right foot: Secondary | ICD-10-CM | POA: Diagnosis not present

## 2020-04-28 DIAGNOSIS — M79671 Pain in right foot: Secondary | ICD-10-CM

## 2020-04-28 NOTE — Patient Instructions (Signed)

## 2020-04-28 NOTE — Progress Notes (Signed)
Subjective:   Patient ID: Danielle Hudson, female   DOB: 59 y.o.   MRN: [id number]   HPI Patient states she developed quite a bit of discomfort in the plantar aspect of the right heel and also has some discomfort in the big toe joint right at times.  She is very active and states that this is been ongoing and the heel has been bothering her for quite a period of time   ROS      Objective:  Physical Exam  Neurovascular status intact with exquisite discomfort plantar aspect right heel at the insertional point tendon calcaneus with inflammation fluid buildup and is noted to have hypertrophy and enlargement of the first metatarsal right with moderate diminishment of joint motion with mild discomfort     Assessment:  Acute plantar fasciitis right with functional hallux limitus deformity with beginnings of structural changes     Plan:  H&P x-rays reviewed both conditions discussed.  For the functional hallux limitus I recommended rigid bottom shoes anti-inflammatories and possible orthotics which I educated him on today.  I went ahead and also did sterile prep and injected the plantar fascial right 3 mg Kenalog 5 mg Xylocaine and instructed on support type therapy.  Reappoint as symptoms indicate  X-rays indicate small spur formation plantar heel moderate depression of the arch with functional elevation first metatarsal segment right with small spur formation

## 2020-07-28 ENCOUNTER — Other Ambulatory Visit: Payer: Self-pay | Admitting: Podiatry

## 2020-07-28 NOTE — Telephone Encounter (Signed)
Please advise 

## 2020-07-29 NOTE — Telephone Encounter (Signed)
She should come in if she is still having pain. Can refill but also needs appointment

## 2021-09-26 NOTE — Progress Notes (Signed)
Cardiology Office Note:    Date:  09/28/2021   ID:  Danielle Hudson, DOB May 17, 1961, MRN [id number]  PCP:  Brunetta Jeans, PA-C  Cardiologist:  None  Electrophysiologist:  None   Referring MD: Kerry Dory, NP   Chief Complaint/Reason for Referral: Chest pain  History of Present Illness:    Danielle Hudson is a 60 y.o. female with the indicated history here for recommendations regarding chest discomfort and bradycardia.  The patient was seen by her primary care provider recently.  She was complaining of fatigue.  She also describes a chest heaviness.  Sometimes happens with deep inspiration.  She has incidentally noted that her heart rate is in the high 40s at times but she is asymptomatic from this.    She tells me she has good days and bad days.  There are days where she has a lot of fatigue and needs to lay down.  Occasionally she will get chest heaviness.  This is not related to exertion.  For example she was doing the dishes and it just came on all of a sudden.  She laid down after a few minutes and went away.  Denies any presyncope or syncope.  She gets very rare palpitations but these are not bothersome related to shortness of breath, chest pain, presyncope.  She denies any peripheral edema.  She denies any severe bleeding or bruising.  Her symptoms do not seem to be related to anything she eats in particular or any GI tract symptoms.  She notes that on her apple watch at times when she is laying down her heart rate will be in the high 40s.  She is asymptomatic with this.  She has required no emergency room visits or hospitalizations.  Past Medical History:  Diagnosis Date   ADHD    Anxiety    Bradycardia    Depression    Family history of premature CAD    Fatigue    Hypothyroidism    Loss of hair    Thyroid disease     Past Surgical History:  Procedure Laterality Date   CESAREAN SECTION     COSMETIC SURGERY     PLACEMENT OF BREAST IMPLANTS Bilateral 1990     Current Medications: Current Meds  Medication Sig   amphetamine-dextroamphetamine (ADDERALL) 20 MG tablet Take 30 mg by mouth daily.    buPROPion (WELLBUTRIN SR) 100 MG 12 hr tablet TK 1 T PO  D   fish oil-omega-3 fatty acids 1000 MG capsule Take 2 g by mouth daily.   KRILL OIL PO Take 1 tablet by mouth daily.   levothyroxine (SYNTHROID) 75 MCG tablet Take 75 mcg by mouth every morning.   metoprolol tartrate (LOPRESSOR) 100 MG tablet Take 1 tablet (100 mg total) by mouth once for 1 dose. Take 90-120 minutes prior to scan.   Multiple Vitamin (MULTIVITAMIN) tablet Take 1 tablet by mouth daily.   Multiple Vitamins-Minerals (HAIR SKIN AND NAILS FORMULA) TABS Take 1 tablet by mouth daily.   pantoprazole (PROTONIX) 40 MG tablet Take 1 tablet (40 mg total) by mouth daily.   valACYclovir (VALTREX) 500 MG tablet Take 500 mg by mouth 2 (two) times daily.   vitamin C (ASCORBIC ACID) 500 MG tablet Take 500 mg by mouth daily.   Vitamin D, Ergocalciferol, (DRISDOL) 1.25 MG (50000 UNIT) CAPS capsule Take 50,000 Units by mouth once a week.   [DISCONTINUED] aspirin 81 MG tablet Take 81 mg by mouth daily.  Allergies:   Penicillin g   Social History   Tobacco Use   Smoking status: Former    Types: Cigarettes   Smokeless tobacco: Never  Vaping Use   Vaping Use: Never used  Substance Use Topics   Alcohol use: No   Drug use: No     Family History: The patient's family history includes Alzheimer's disease in her mother and sister; Breast cancer in an other family member; Cancer in her brother and mother; Cirrhosis in her mother; Diabetes in her father, mother, and another family member; Heart disease in her father, mother, and another family member; Hepatitis C in her mother; Liver disease in her mother and another family member.  ROS:   Please see the history of present illness.    All other systems reviewed and are negative.  EKGs/Labs/Other Studies Reviewed:    The following studies  were reviewed today:  EKG: EKG today demonstrates sinus rhythm with rare PVC  Imaging studies that I have independently reviewed today: EKG from February 2021 demonstrates normal sinus rhythm no Q waves or ST changes.  There is baseline artifact in lead V6.  Recent Labs: No results found for requested labs within last 8760 hours.   Labs from November 2022 demonstrated TSH of 2.1, hemoglobin A1c of 5.6, hemoglobin 15.1, platelets 280, sodium 139 potassium 5.4 BUN 18 creatinine 1.09 LFTs within normal limits  Recent Lipid Panel    Component Value Date/Time   CHOL 181 12/28/2019 1356   TRIG 278.0 (H) 12/28/2019 1356   HDL 57.10 12/28/2019 1356   CHOLHDL 3 12/28/2019 1356   VLDL 55.6 (H) 12/28/2019 1356   LDLDIRECT 99.0 12/28/2019 1356    Physical Exam:    VS:  BP 110/70   Pulse 91   Ht 5\' 6"  (1.676 m)   Wt 148 lb (67.1 kg)   SpO2 99%   BMI 23.89 kg/m     Wt Readings from Last 5 Encounters:  09/28/21 148 lb (67.1 kg)  12/28/19 156 lb (70.8 kg)  08/31/13 147 lb 8 oz (66.9 kg)  08/07/13 143 lb (64.9 kg)  08/06/13 143 lb 9.6 oz (65.1 kg)    GENERAL:  No apparent distress, AOx3 HEENT:  No carotid bruits, +2 carotid impulses, no scleral icterus CAR: RRR no murmurs, gallops, rubs, or thrills RES:  Clear to auscultation bilaterally ABD:  Soft, nontender, nondistended, positive bowel sounds x 4 VASC:  +2 radial pulses, +2 carotid pulses, palpable pedal pulses NEURO:  CN 2-12 grossly intact; motor and sensory grossly intact PSYCH:  No active depression or anxiety  ASSESSMENT:    1. Chest pain, unspecified type   2. Precordial pain    PLAN:    Chest pain, unspecified type - Plan: EKG 12-Lead, CT CORONARY MORPH W/CTA COR W/SCORE W/CA W/CM &/OR WO/CM, Basic Metabolic Panel (BMET)  Precordial pain - Plan: EKG 12-Lead, CT CORONARY MORPH W/CTA COR W/SCORE W/CA W/CM &/OR WO/CM, Basic Metabolic Panel (BMET)  Will obtain a coronary CTA to evaluate further.  Her chest pain symptoms  have some atypical features.  I have asked her to stop aspirin as she has no documented reason to be on aspirin including coronary artery disease, peripheral vascular disease, history of stroke, or diabetes.  If this is unremarkable perhaps a sleep apnea evaluation should be considered.  I will keep follow-up with me open-ended.      Shared Decision Making/Informed Consent:       Medication Adjustments/Labs and Tests Ordered: Current medicines are  reviewed at length with the patient today.  Concerns regarding medicines are outlined above.   Orders Placed This Encounter  Procedures   CT CORONARY MORPH W/CTA COR W/SCORE W/CA W/CM &/OR WO/CM   Basic Metabolic Panel (BMET)   EKG 12-Lead     Meds ordered this encounter  Medications   metoprolol tartrate (LOPRESSOR) 100 MG tablet    Sig: Take 1 tablet (100 mg total) by mouth once for 1 dose. Take 90-120 minutes prior to scan.    Dispense:  1 tablet    Refill:  0     Patient Instructions  Medication Instructions:  Stop Aspirin *If you need a refill on your cardiac medications before your next appointment, please call your pharmacy*   Lab Work: Today: BMET  If you have labs (blood work) drawn today and your tests are completely normal, you will receive your results only by: [place] (if you have MyChart) OR A paper copy in the mail If you have any lab test that is abnormal or we need to change your treatment, we will call you to review the results.   Testing/Procedures: Cardiac CT - see instructions below   Follow-Up: As needed  Other Instructions   Your cardiac CT will be scheduled at:  [address], [place] [address] [phone]  Please arrive at the [facility] main entrance (entrance A) of [facility] 30 minutes prior to test start time. You can use the FREE valet parking offered at the main entrance (encouraged to control the heart rate for the test) Proceed  to the [facility] Radiology Department (first floor) to check-in and test prep.  Please follow these instructions carefully (unless otherwise directed):   On the Night Before the Test: Be sure to Drink plenty of water. Do not consume any caffeinated/decaffeinated beverages or chocolate 12 hours prior to your test. Do not take any antihistamines 12 hours prior to your test.  On the Day of the Test: Drink plenty of water until 1 hour prior to the test. Do not eat any food 4 hours prior to the test. You may take your regular medications prior to the test.  Take metoprolol (Lopressor) two hours prior to test. FEMALES- please wear underwire-free bra if available, avoid dresses & tight clothing       After the Test: Drink plenty of water. After receiving IV contrast, you may experience a mild flushed feeling. This is normal. On occasion, you may experience a mild rash up to 24 hours after the test. This is not dangerous. If this occurs, you can take Benadryl 25 mg and increase your fluid intake. If you experience trouble breathing, this can be serious. If it is severe call 911 IMMEDIATELY. If it is mild, please call our office. If you take any of these medications: Glipizide/Metformin, Avandament, Glucavance, please do not take 48 hours after completing test unless otherwise instructed.  Please allow 2-4 weeks for scheduling of routine cardiac CTs. Some insurance companies require a pre-authorization which may delay scheduling of this test.   For non-scheduling related questions, please contact the cardiac imaging nurse navigator should you have any questions/concerns: Marchia Bond, Cardiac Imaging Nurse Navigator Gordy Clement, Cardiac Imaging Nurse Navigator [facility] Direct Office Dial: [phone]   For scheduling needs, including cancellations and rescheduling, please call [place], [phone].

## 2021-09-28 ENCOUNTER — Other Ambulatory Visit: Payer: Self-pay

## 2021-09-28 ENCOUNTER — Encounter: Payer: Self-pay | Admitting: Internal Medicine

## 2021-09-28 ENCOUNTER — Ambulatory Visit: Payer: BC Managed Care – PPO | Admitting: Internal Medicine

## 2021-09-28 VITALS — BP 110/70 | HR 91 | Ht 66.0 in | Wt 148.0 lb

## 2021-09-28 DIAGNOSIS — R072 Precordial pain: Secondary | ICD-10-CM

## 2021-09-28 DIAGNOSIS — R079 Chest pain, unspecified: Secondary | ICD-10-CM

## 2021-09-28 MED ORDER — METOPROLOL TARTRATE 100 MG PO TABS: 100.0000 mg | ORAL_TABLET | Freq: Once | ORAL | 0 refills | Status: DC

## 2021-09-28 NOTE — Patient Instructions (Addendum)
Medication Instructions:  Stop Aspirin *If you need a refill on your cardiac medications before your next appointment, please call your pharmacy*   Lab Work: Please send a copy of your recent blood work from gynecology (09/27/21)  If you have labs (blood work) drawn today and your tests are completely normal, you will receive your results only by: [place] (if you have MyChart) OR A paper copy in the mail If you have any lab test that is abnormal or we need to change your treatment, we will call you to review the results.   Testing/Procedures: Cardiac CT - see instructions below   Follow-Up: As needed  Other Instructions   Your cardiac CT will be scheduled at:  [facility] [place],  [address] [phone]  Please arrive at the [facility] main entrance (entrance A) of [facility] 30 minutes prior to test start time. You can use the FREE valet parking offered at the main entrance (encouraged to control the heart rate for the test) Proceed to the [facility] Radiology Department (first floor) to check-in and test prep.  Please follow these instructions carefully (unless otherwise directed):   On the Night Before the Test: Be sure to Drink plenty of water. Do not consume any caffeinated/decaffeinated beverages or chocolate 12 hours prior to your test. Do not take any antihistamines 12 hours prior to your test.  On the Day of the Test: Drink plenty of water until 1 hour prior to the test. Do not eat any food 4 hours prior to the test. You may take your regular medications prior to the test.  Take metoprolol (Lopressor) two hours prior to test. FEMALES- please wear underwire-free bra if available, avoid dresses & tight clothing       After the Test: Drink plenty of water. After receiving IV contrast, you may experience a mild flushed feeling. This is normal. On occasion, you may experience a mild rash up to 24 hours  after the test. This is not dangerous. If this occurs, you can take Benadryl 25 mg and increase your fluid intake. If you experience trouble breathing, this can be serious. If it is severe call 911 IMMEDIATELY. If it is mild, please call our office. If you take any of these medications: Glipizide/Metformin, Avandament, Glucavance, please do not take 48 hours after completing test unless otherwise instructed.  Please allow 2-4 weeks for scheduling of routine cardiac CTs. Some insurance companies require a pre-authorization which may delay scheduling of this test.   For non-scheduling related questions, please contact the cardiac imaging nurse navigator should you have any questions/concerns: Marchia Bond, Cardiac Imaging Nurse Navigator Gordy Clement, Cardiac Imaging Nurse Navigator [facility] Direct Office Dial: [phone]   For scheduling needs, including cancellations and rescheduling, please call [place], [phone].

## 2021-09-30 ENCOUNTER — Other Ambulatory Visit: Payer: Self-pay | Admitting: Internal Medicine

## 2021-10-05 ENCOUNTER — Telehealth (HOSPITAL_COMMUNITY): Payer: Self-pay | Admitting: Emergency Medicine

## 2021-10-05 DIAGNOSIS — R072 Precordial pain: Secondary | ICD-10-CM

## 2021-10-05 MED ORDER — IVABRADINE HCL 5 MG PO TABS: 5.0000 mg | ORAL_TABLET | Freq: Once | ORAL | 0 refills | Status: AC

## 2021-10-05 NOTE — Telephone Encounter (Signed)
Reaching out to patient to offer assistance regarding upcoming cardiac imaging study; pt verbalizes understanding of appt date/time, parking situation and where to check in, pre-test NPO status and medications ordered, and verified current allergies; name and call back number provided for further questions should they arise Marchia Bond RN [facility] [phone] office [phone] cell  Arrival 11:00 100mg  metoprolol + 5mg  ivabradine Calling OB office for recent lab work

## 2021-10-09 ENCOUNTER — Other Ambulatory Visit: Payer: Self-pay

## 2021-10-09 ENCOUNTER — Ambulatory Visit (HOSPITAL_COMMUNITY)
Admission: RE | Admit: 2021-10-09 | Discharge: 2021-10-09 | Disposition: A | Discharge status: Home / Self Care | Payer: BC Managed Care – PPO | Source: Ambulatory Visit | Attending: Internal Medicine | Admitting: Internal Medicine

## 2021-10-09 DIAGNOSIS — R072 Precordial pain: Secondary | ICD-10-CM

## 2021-10-09 DIAGNOSIS — R079 Chest pain, unspecified: Secondary | ICD-10-CM | POA: Diagnosis present

## 2021-10-09 MED ORDER — NITROGLYCERIN 0.4 MG SL SUBL
0.8000 mg | SUBLINGUAL_TABLET | Freq: Once | SUBLINGUAL | Status: AC
  Administered 2021-10-09: 0.8 mg via SUBLINGUAL

## 2021-10-09 MED ORDER — IOHEXOL 350 MG/ML SOLN
100.0000 mL | Freq: Once | INTRAVENOUS | Status: AC | PRN
  Administered 2021-10-09: 100 mL via INTRAVENOUS

## 2021-10-09 MED ORDER — NITROGLYCERIN 0.4 MG SL SUBL
SUBLINGUAL_TABLET | SUBLINGUAL | Status: AC
  Filled 2021-10-09: qty 2

## 2022-01-23 ENCOUNTER — Other Ambulatory Visit: Payer: Self-pay | Admitting: Obstetrics and Gynecology

## 2022-01-23 DIAGNOSIS — N6310 Unspecified lump in the right breast, unspecified quadrant: Secondary | ICD-10-CM

## 2022-02-13 ENCOUNTER — Other Ambulatory Visit: Payer: Self-pay

## 2022-02-13 ENCOUNTER — Ambulatory Visit
Admission: RE | Admit: 2022-02-13 | Discharge: 2022-02-13 | Disposition: A | Discharge status: Home / Self Care | Payer: BC Managed Care – PPO | Source: Ambulatory Visit | Attending: Obstetrics and Gynecology | Admitting: Obstetrics and Gynecology

## 2022-02-13 DIAGNOSIS — N6310 Unspecified lump in the right breast, unspecified quadrant: Secondary | ICD-10-CM

## 2022-10-28 IMAGING — MG DIGITAL DIAGNOSTIC BILAT W/ TOMO W/ CAD
8 of 16 series · 8 of 36 positions shown · non-contrast
Comparison: Previous exam(s).

CLINICAL DATA: Palpable lump on the right.

EXAM:
DIGITAL DIAGNOSTIC BILATERAL MAMMOGRAM WITH TOMOSYNTHESIS AND CAD;
ULTRASOUND RIGHT BREAST LIMITED
TECHNIQUE: Bilateral digital diagnostic mammography and breast tomosynthesis
was performed. The images were evaluated with computer-aided
detection.; Targeted ultrasound examination of the right breast was
performed

[R MLO]
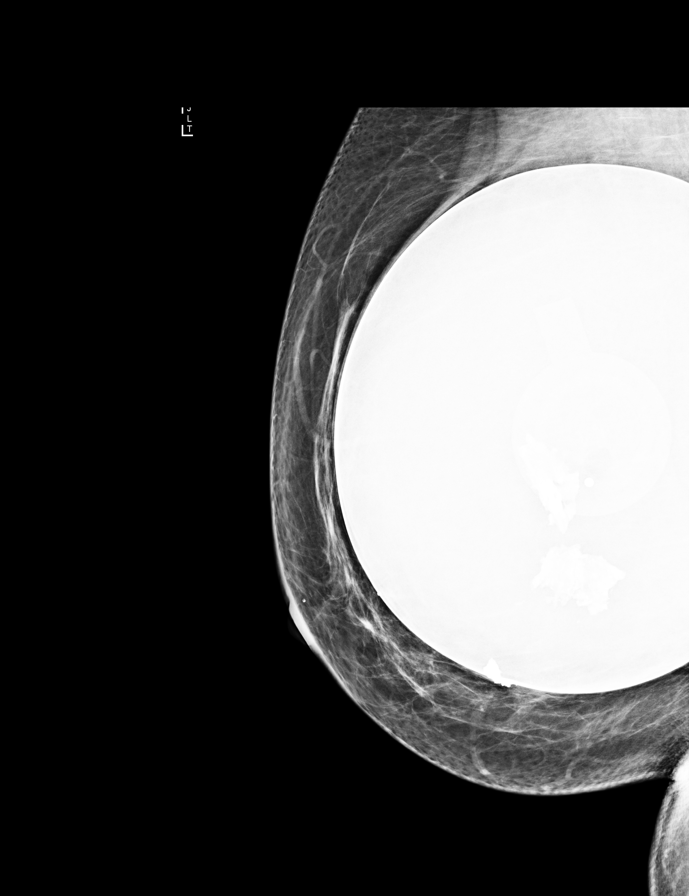

[L MLO (1 of 3)]
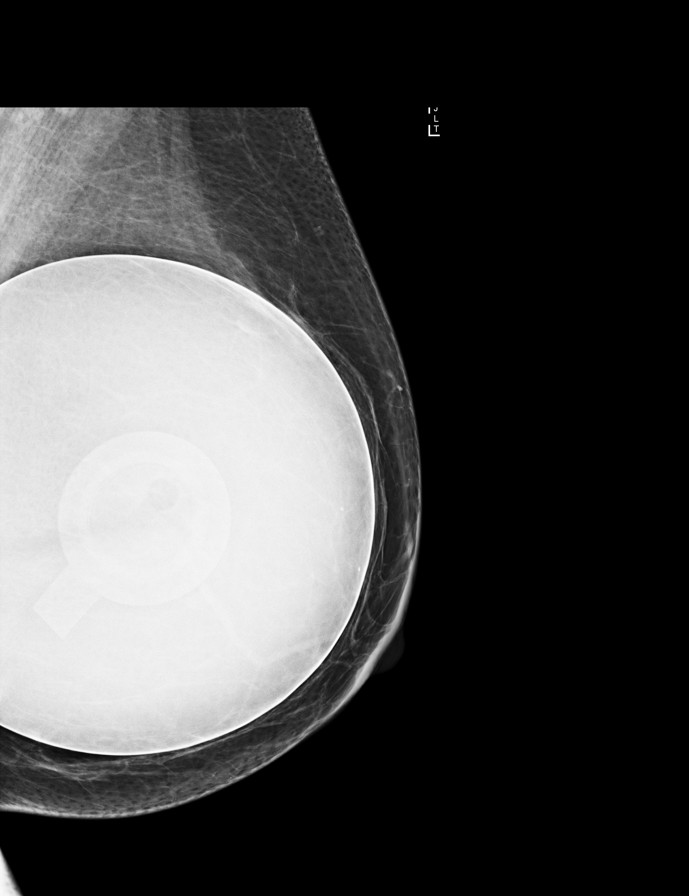

[L MLO (2 of 3)]
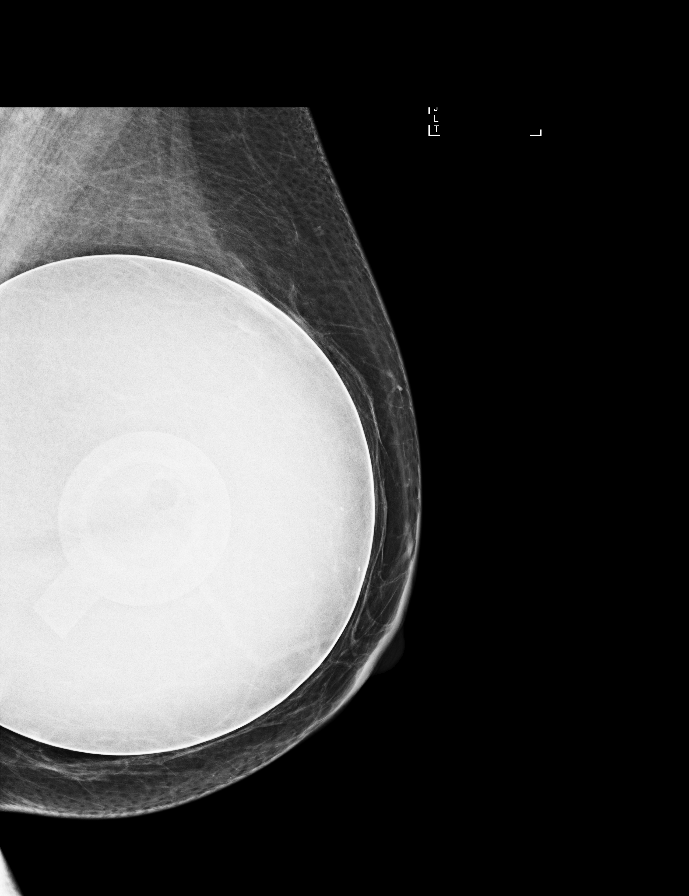

[L MLO (3 of 3)]
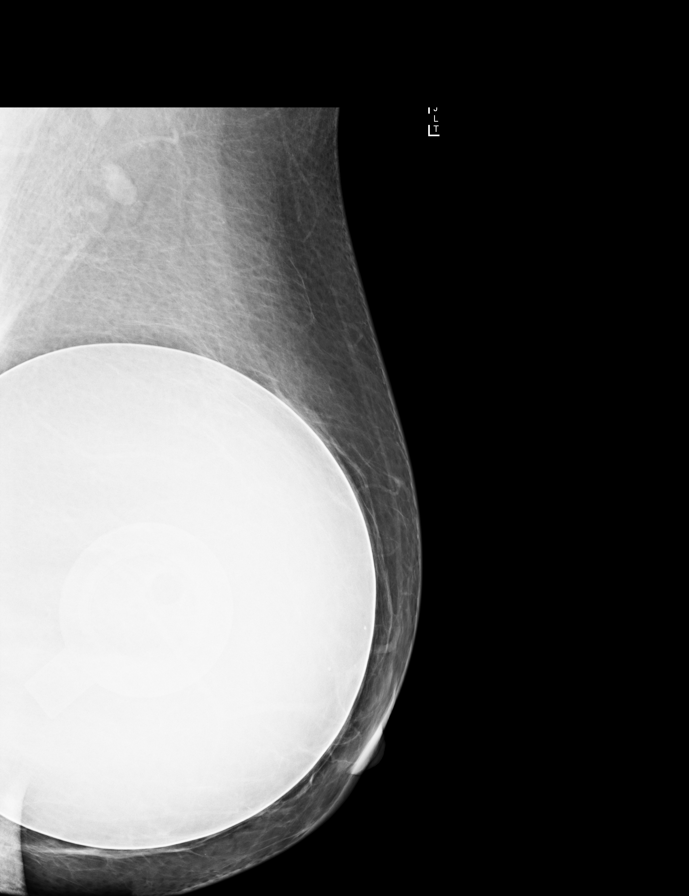

[L CC]
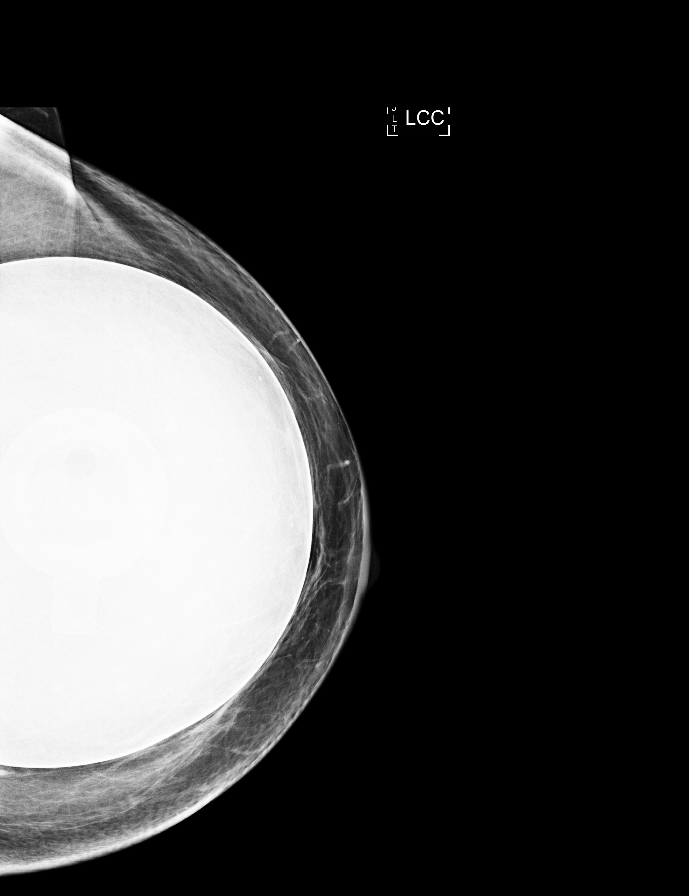

[R CC]
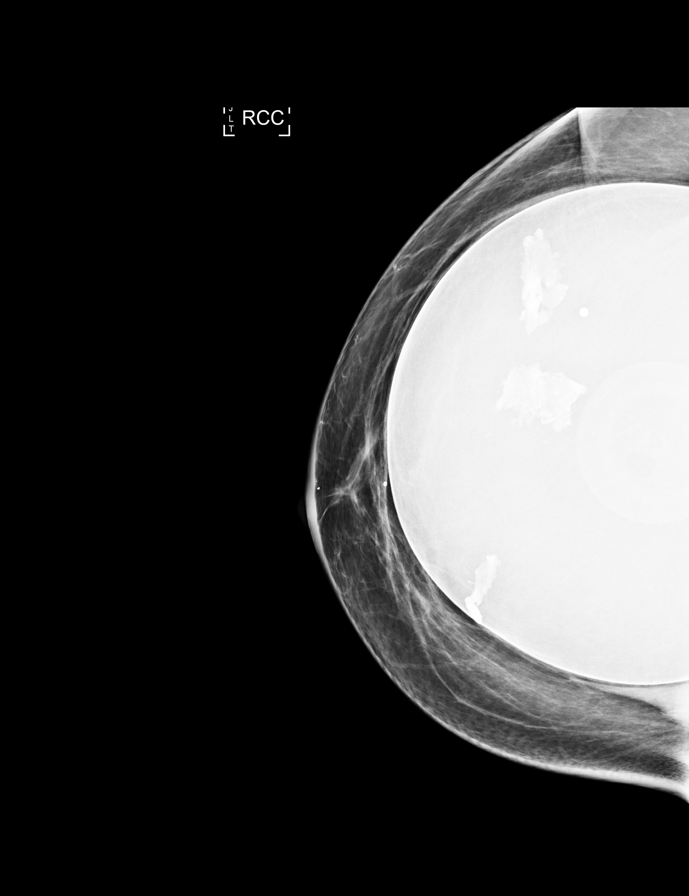

[R CC synth-2D]
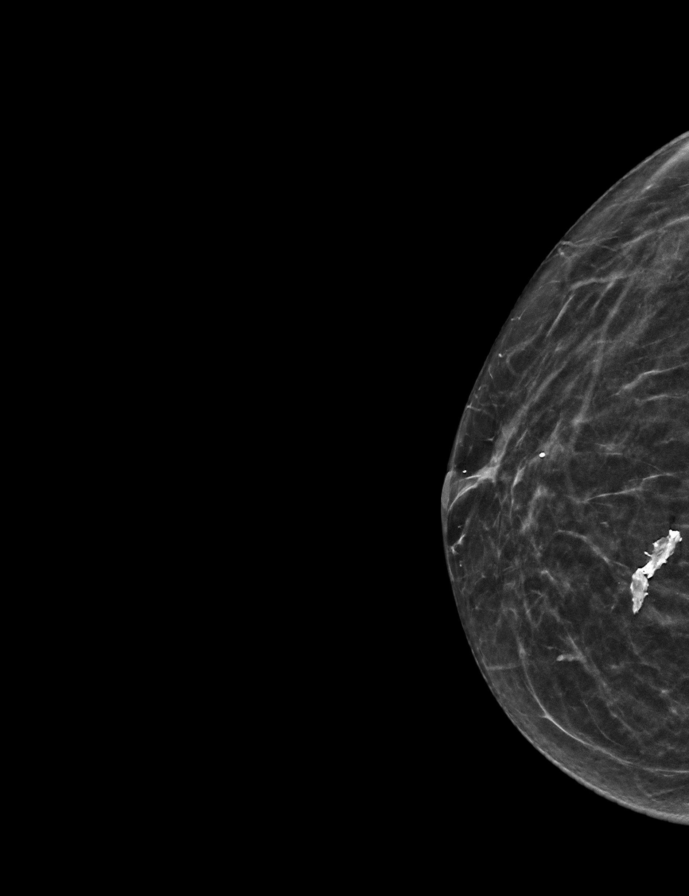

[R MLO synth-2D]
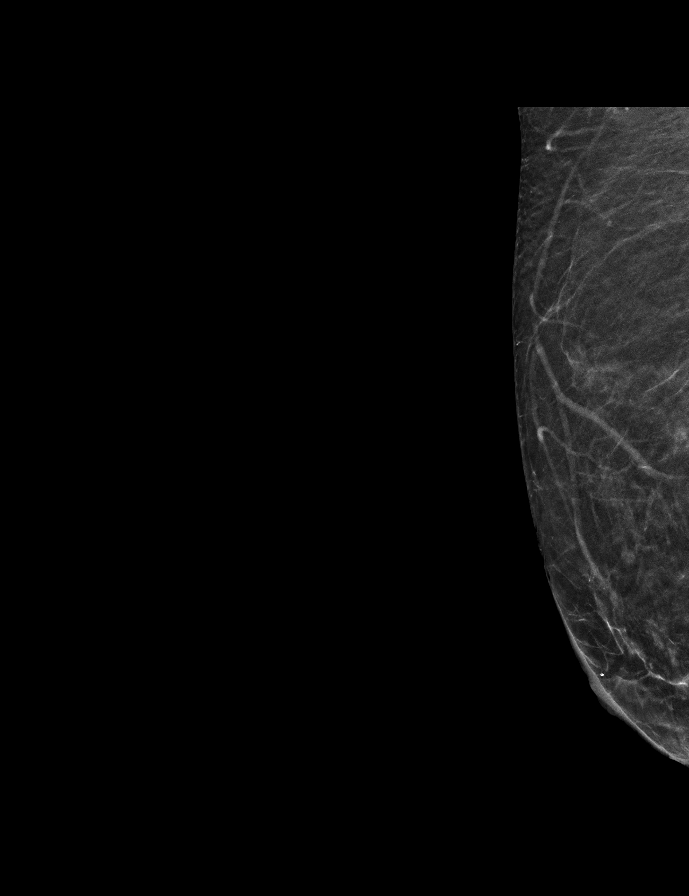

[8 of 36 positions shown; findings below may reference images not displayed]

ACR Breast Density Category b: There are scattered areas of
fibroglandular density.
FINDINGS: The patient appears to be feeling a capsular calcification. No
suspicious masses, calcifications, or distortion are identified in
either breast. A right breast asymmetry resolves on additional
imaging.

Targeted ultrasound is performed, showing no suspicious masses in
the region of the patient's symptoms. The patient appears to be
feeling a capsular calcification with dense posterior shadowing.
IMPRESSION: The patient appears to be feeling 80 capsular calcification with
dense posterior shadowing on ultrasound. No evidence of malignancy
in either breast.

RECOMMENDATION:
Annual screening mammography.

I have discussed the findings and recommendations with the patient.
If applicable, a reminder letter will be sent to the patient
regarding the next appointment.

BI-RADS CATEGORY  2: Benign.

## 2023-08-06 NOTE — Progress Notes (Signed)
treated

## 2023-08-30 ENCOUNTER — Other Ambulatory Visit: Payer: Self-pay | Admitting: Obstetrics and Gynecology

## 2023-08-30 DIAGNOSIS — Z803 Family history of malignant neoplasm of breast: Secondary | ICD-10-CM

## 2023-10-12 ENCOUNTER — Ambulatory Visit
Admission: RE | Admit: 2023-10-12 | Discharge: 2023-10-12 | Disposition: A | Discharge status: Home / Self Care | Payer: BC Managed Care – PPO | Source: Ambulatory Visit | Attending: Obstetrics and Gynecology | Admitting: Obstetrics and Gynecology

## 2023-10-12 DIAGNOSIS — Z803 Family history of malignant neoplasm of breast: Secondary | ICD-10-CM

## 2023-10-12 MED ORDER — GADOPICLENOL 0.5 MMOL/ML IV SOLN
7.0000 mL | Freq: Once | INTRAVENOUS | Status: AC | PRN
  Administered 2023-10-12: 7 mL via INTRAVENOUS

## 2024-02-14 ENCOUNTER — Other Ambulatory Visit: Payer: Self-pay | Admitting: Obstetrics and Gynecology

## 2024-02-14 DIAGNOSIS — Z9189 Other specified personal risk factors, not elsewhere classified: Secondary | ICD-10-CM

## 2024-03-18 ENCOUNTER — Encounter: Payer: Self-pay | Admitting: Gastroenterology

## 2024-04-22 ENCOUNTER — Ambulatory Visit (AMBULATORY_SURGERY_CENTER)

## 2024-04-22 VITALS — Ht 65.0 in | Wt 151.0 lb

## 2024-04-22 DIAGNOSIS — Z1211 Encounter for screening for malignant neoplasm of colon: Secondary | ICD-10-CM

## 2024-04-22 MED ORDER — NA SULFATE-K SULFATE-MG SULF 17.5-3.13-1.6 GM/177ML PO SOLN: 1.0000 | Freq: Once | ORAL | 0 refills | Status: AC

## 2024-04-22 NOTE — Progress Notes (Signed)
 Pre visit completed via phone call; Patient verified name, DOB, and address; No egg or soy allergy known to patient;  No issues known to pt with past sedation with any surgeries or procedures; Patient denies ever being told they had issues or difficulty with intubation;  No FH of Malignant Hyperthermia; Pt is not on diet pills; Pt is not on home 02;  Pt is not on blood thinners;  Pt reports issues with constipation = patient reports she takes a probiotic po daily to assist with constipation; patient reports she is having to use  laxatives about every other month for assistance with constipation; patient requesting to use Miralax daily for 5 days prior to prep starting; No A fib or A flutter; Have any cardiac testing pending--NO Insurance verified during PV appt--- BCBS PPO Pt can ambulate without assistance;  Pt denies use of chewing tobacco; Discussed diabetic/weight loss medication holds; Discussed NSAID holds; Checked BMI to be less than 50; Pt instructed to use [employer] or GoodRx for a price reduction on prep;  Patient's chart reviewed by Rogena Class CNRA prior to previsit and patient appropriate for the [facility];  Pre visit completed and red dot placed by patient's name on their procedure day (on provider's schedule);   Instructions sent to MyChart per patient request; RN reiterated

## 2024-04-23 ENCOUNTER — Encounter: Payer: Self-pay | Admitting: Gastroenterology

## 2024-05-08 ENCOUNTER — Ambulatory Visit: Admitting: Gastroenterology

## 2024-05-08 ENCOUNTER — Encounter: Payer: Self-pay | Admitting: Gastroenterology

## 2024-05-08 VITALS — BP 104/67 | HR 74 | Temp 97.5°F | Resp 14 | Ht 65.0 in | Wt 151.0 lb

## 2024-05-08 DIAGNOSIS — Z1211 Encounter for screening for malignant neoplasm of colon: Secondary | ICD-10-CM | POA: Diagnosis present

## 2024-05-08 DIAGNOSIS — K6289 Other specified diseases of anus and rectum: Secondary | ICD-10-CM | POA: Diagnosis not present

## 2024-05-08 DIAGNOSIS — Q438 Other specified congenital malformations of intestine: Secondary | ICD-10-CM | POA: Diagnosis not present

## 2024-05-08 DIAGNOSIS — D122 Benign neoplasm of ascending colon: Secondary | ICD-10-CM | POA: Diagnosis not present

## 2024-05-08 DIAGNOSIS — D123 Benign neoplasm of transverse colon: Secondary | ICD-10-CM

## 2024-05-08 MED ORDER — SODIUM CHLORIDE 0.9 % IV SOLN: 500.0000 mL | Freq: Once | INTRAVENOUS | Status: DC

## 2024-05-08 NOTE — Progress Notes (Signed)
 Pt's states no medical or surgical changes since previsit or office visit.

## 2024-05-08 NOTE — Op Note (Addendum)
 [facility] Patient Name: Danielle Hudson Procedure Date: 05/08/2024 8:34 AM MRN: [id number] Endoscopist: Ace Abu L. Dominic Friendly , MD, [id number] Age: 63 Referring MD:  Date of Birth: July 13, 1961 Gender: Female Account #: [id number] Procedure:                Colonoscopy Indications:              Screening for colorectal malignant neoplasm                           no polyps last colonoscopy in 2014 Medicines:                Monitored Anesthesia Care Procedure:                Pre-Anesthesia Assessment:                           - Prior to the procedure, a History and Physical                            was performed, and patient medications and                            allergies were reviewed. The patient's tolerance of                            previous anesthesia was also reviewed. The risks                            and benefits of the procedure and the sedation                            options and risks were discussed with the patient.                            All questions were answered, and informed consent                            was obtained. Prior Anticoagulants: The patient has                            taken no anticoagulant or antiplatelet agents. ASA                            Grade Assessment: II - A patient with mild systemic                            disease. After reviewing the risks and benefits,                            the patient was deemed in satisfactory condition to                            undergo the procedure.  After obtaining informed consent, the colonoscope                            was passed under direct vision. Throughout the                            procedure, the patient's blood pressure, pulse, and                            oxygen saturations were monitored continuously. The                            CF [id number] #[id number] was introduced through the anus                            and advanced to the the  cecum, identified by                            appendiceal orifice and ileocecal valve. The                            colonoscopy was somewhat difficult due to a                            redundant colon. Successful completion of the                            procedure was aided by straightening and shortening                            the scope to obtain bowel loop reduction. The                            patient tolerated the procedure well. The quality                            of the bowel preparation was excellent. The                            ileocecal valve, appendiceal orifice, and rectum                            were photographed. Scope In: 8:43:07 AM Scope Out: 9:00:22 AM Scope Withdrawal Time: 0 hours 11 minutes 32 seconds  Total Procedure Duration: 0 hours 17 minutes 15 seconds  Findings:                 The perianal and digital rectal examinations were                            normal.                           Repeat examination of right colon under NBI  performed.                           Two sessile polyps were found in the transverse                            colon and ascending colon. The polyps were                            diminutive in size. These polyps were removed with                            a cold snare. Resection and retrieval were complete.                           Anal papilla(e) were hypertrophied.                           The exam was otherwise without abnormality on                            direct and retroflexion views. Complications:            No immediate complications. Estimated Blood Loss:     Estimated blood loss was minimal. Impression:               - Two diminutive polyps in the transverse colon and                            in the ascending colon, removed with a cold snare.                            Resected and retrieved.                           - Anal papilla(e) were hypertrophied.                            - The examination was otherwise normal on direct                            and retroflexion views. Recommendation:           - Patient has a contact number available for                            emergencies. The signs and symptoms of potential                            delayed complications were discussed with the                            patient. Return to normal activities tomorrow.                            Written discharge instructions were provided to the  patient.                           - Resume previous diet.                           - Continue present medications.                           - Await pathology results.                           - Repeat colonoscopy is recommended for                            surveillance. The colonoscopy date will be                            determined after pathology results from today's                            exam become available for review. Danielle Hudson L. Dominic Friendly, MD 05/08/2024 9:04:33 AM This report has been signed electronically.

## 2024-05-08 NOTE — Patient Instructions (Signed)

## 2024-05-08 NOTE — Progress Notes (Signed)
 Called to room to assist during endoscopic procedure.  Patient ID and intended procedure confirmed with present staff. Received instructions for my participation in the procedure from the performing physician.

## 2024-05-08 NOTE — Progress Notes (Signed)
 History and Physical:  This patient presents for endoscopic testing for: Encounter Diagnosis  Name Primary?   Colon cancer screening Yes    63 year old person here today for a screening colonoscopy.  Her last colonoscopy in 2014 had no polyps Patient denies chronic abdominal pain, rectal bleeding, constipation or diarrhea.   Patient is otherwise without complaints or active issues today.   Past Medical History: Past Medical History:  Diagnosis Date   ADHD    Anxiety    Bradycardia    Depression    Family history of premature CAD    Fatigue    Hypothyroidism    Loss of hair    Thyroid disease      Past Surgical History: Past Surgical History:  Procedure Laterality Date   CESAREAN SECTION     COLONOSCOPY  2014   RK-MAC-suprep(exc)-recall due 07/2023   COSMETIC SURGERY     PLACEMENT OF BREAST IMPLANTS Bilateral 1990    Allergies: Allergies  Allergen Reactions   Penicillin G     Unknown; patient was young when she had a reaction.    Outpatient Meds: Current Outpatient Medications  Medication Sig Dispense Refill   amphetamine-dextroamphetamine (ADDERALL) 30 MG tablet Take 30 mg by mouth daily.     buPROPion (WELLBUTRIN SR) 100 MG 12 hr tablet TK 1 T PO  D  6   levothyroxine (SYNTHROID) 75 MCG tablet Take 75 mcg by mouth every morning.     Multiple Vitamin (MULTIVITAMIN) tablet Take 1 tablet by mouth daily.     zolpidem (AMBIEN) 5 MG tablet Take 1 tablet by mouth at bedtime as needed.     Ascorbic Acid (VITAMIN C PO) Take 1 tablet by mouth every other day. Tu, TH, S     aspirin EC 81 MG tablet Take 81 mg by mouth daily.     Cholecalciferol (VITAMIN D-3 PO) Take 1 tablet by mouth every other day. MWF     Probiotic Product (PROBIOTIC DAILY PO) Take 1 capsule by mouth daily.     valACYclovir (VALTREX) 500 MG tablet Take 500 mg by mouth 2 (two) times daily as needed.     Current Facility-Administered Medications  Medication Dose Route Frequency Provider Last Rate  Last Admin   0.9 %  sodium chloride  infusion  500 mL Intravenous Once Danis, Benson Porcaro L III, MD          ___________________________________________________________________ Objective   Exam:  BP 129/77   Pulse 77   Temp (!) 97.5 F (36.4 C) (Temporal)   Resp 18   Ht 5' 5 (1.651 m)   Wt 151 lb (68.5 kg)   SpO2 (!) 72%   BMI 25.13 kg/m   CV: regular , S1/S2 Resp: clear to auscultation bilaterally, normal RR and effort noted GI: soft, no tenderness, with active bowel sounds.   Assessment: Encounter Diagnosis  Name Primary?   Colon cancer screening Yes     Plan: Colonoscopy   The benefits and risks of the planned procedure(s) were described in detail with the patient or (when appropriate) their health care proxy.  Risks were outlined as including, but not limited to, bleeding, infection, perforation, adverse medication reaction leading to cardiac or pulmonary decompensation, pancreatitis (if ERCP).  The limitation of incomplete mucosal visualization was also discussed.  No guarantees or warranties were given.  The patient is appropriate for an endoscopic procedure in the ambulatory setting.   - Lorella Roles, MD

## 2024-05-08 NOTE — Progress Notes (Signed)
 Pt sedate, gd SR's, VSS, report to RN

## 2024-05-11 ENCOUNTER — Telehealth: Payer: Self-pay | Admitting: *Deleted

## 2024-05-11 NOTE — Telephone Encounter (Signed)
  Follow up Call-     05/08/2024    7:24 AM  Call back number  Post procedure Call Back phone  # [phone]  Permission to leave phone message Yes     Patient questions:  Do you have a fever, pain , or abdominal swelling? No. Pain Score  0 *  Have you tolerated food without any problems? Yes.    Have you been able to return to your normal activities? Yes.    Do you have any questions about your discharge instructions: Diet   No. Medications  No. Follow up visit  No.  Do you have questions or concerns about your Care? No.  Actions: * If pain score is 4 or above: No action needed, pain <4.

## 2024-05-12 ENCOUNTER — Ambulatory Visit: Payer: Self-pay | Admitting: Gastroenterology

## 2024-05-12 LAB — SURGICAL PATHOLOGY

## 2024-10-24 ENCOUNTER — Ambulatory Visit
Admission: RE | Admit: 2024-10-24 | Discharge: 2024-10-24 | Disposition: A | Source: Ambulatory Visit | Attending: Obstetrics and Gynecology | Admitting: Obstetrics and Gynecology

## 2024-10-24 DIAGNOSIS — Z9189 Other specified personal risk factors, not elsewhere classified: Secondary | ICD-10-CM

## 2024-10-24 MED ORDER — GADOPICLENOL 0.5 MMOL/ML IV SOLN
7.0000 mL | Freq: Once | INTRAVENOUS | Status: AC | PRN
  Administered 2024-10-24: 7 mL via INTRAVENOUS
